# Patient Record
Sex: Male | Born: 1955
Health system: Southern US, Community
[De-identification: ages and names within clinical notes are randomized; demographics above are authoritative.]

## PROBLEM LIST (undated history)

## (undated) HISTORY — PX: WISDOM TOOTH EXTRACTION: SHX21

## (undated) HISTORY — PX: ANKLE FRACTURE SURGERY: SHX122

---

## 2005-02-05 ENCOUNTER — Encounter: Admission: RE | Admit: 2005-02-05 | Discharge: 2005-02-05 | Payer: Self-pay | Admitting: Family Medicine

## 2005-02-12 ENCOUNTER — Encounter: Admission: RE | Admit: 2005-02-12 | Discharge: 2005-02-12 | Payer: Self-pay | Admitting: Family Medicine

## 2005-10-27 ENCOUNTER — Emergency Department (HOSPITAL_COMMUNITY): Admission: EM | Admit: 2005-10-27 | Discharge: 2005-10-27 | Payer: Self-pay | Admitting: *Deleted

## 2005-11-25 ENCOUNTER — Ambulatory Visit (HOSPITAL_COMMUNITY): Admission: RE | Admit: 2005-11-25 | Discharge: 2005-11-25 | Payer: Self-pay | Admitting: Orthopedic Surgery

## 2007-01-28 IMAGING — CR DG LUMBAR SPINE COMPLETE 4+V
5 series · 5 of 5 positions shown · non-contrast
Comparison: none

CLINICAL DATA: Low back pain radiating down the right leg.
 LUMBAR SPINE COMPLETE ? FOUR VIEWS:

[view not recorded (1 of 5)]
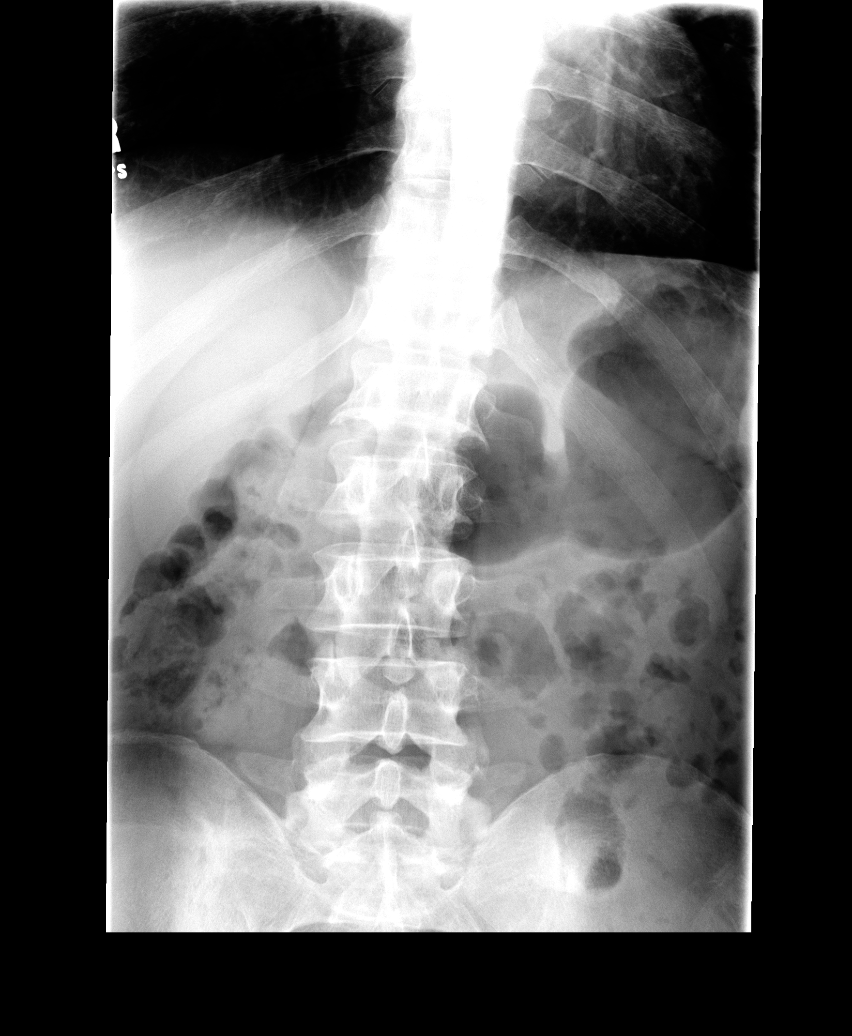

[view not recorded (2 of 5)]
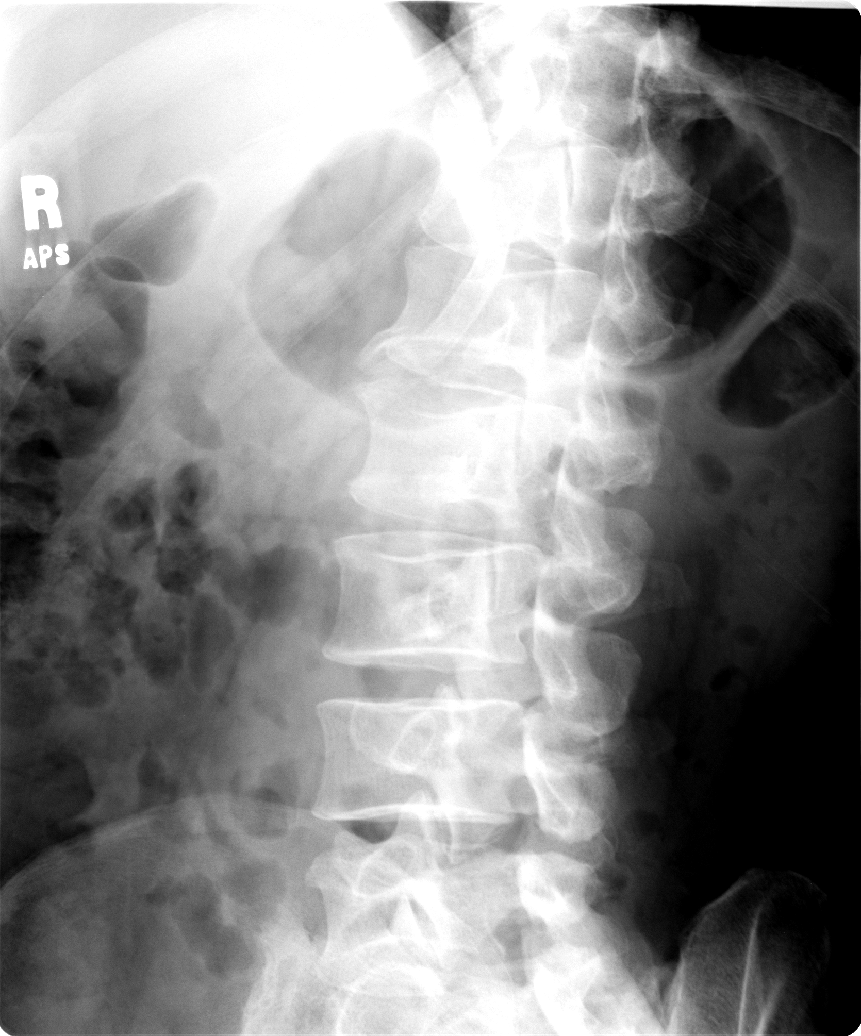

[view not recorded (3 of 5)]
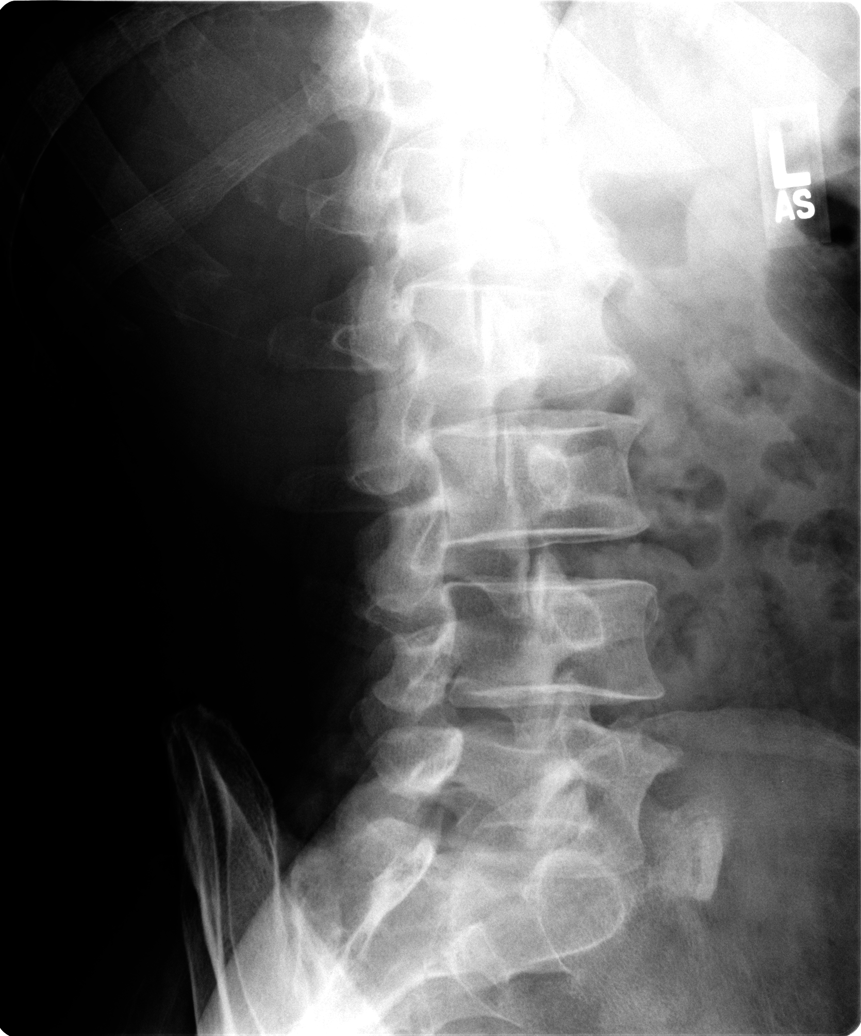

[view not recorded (4 of 5)]
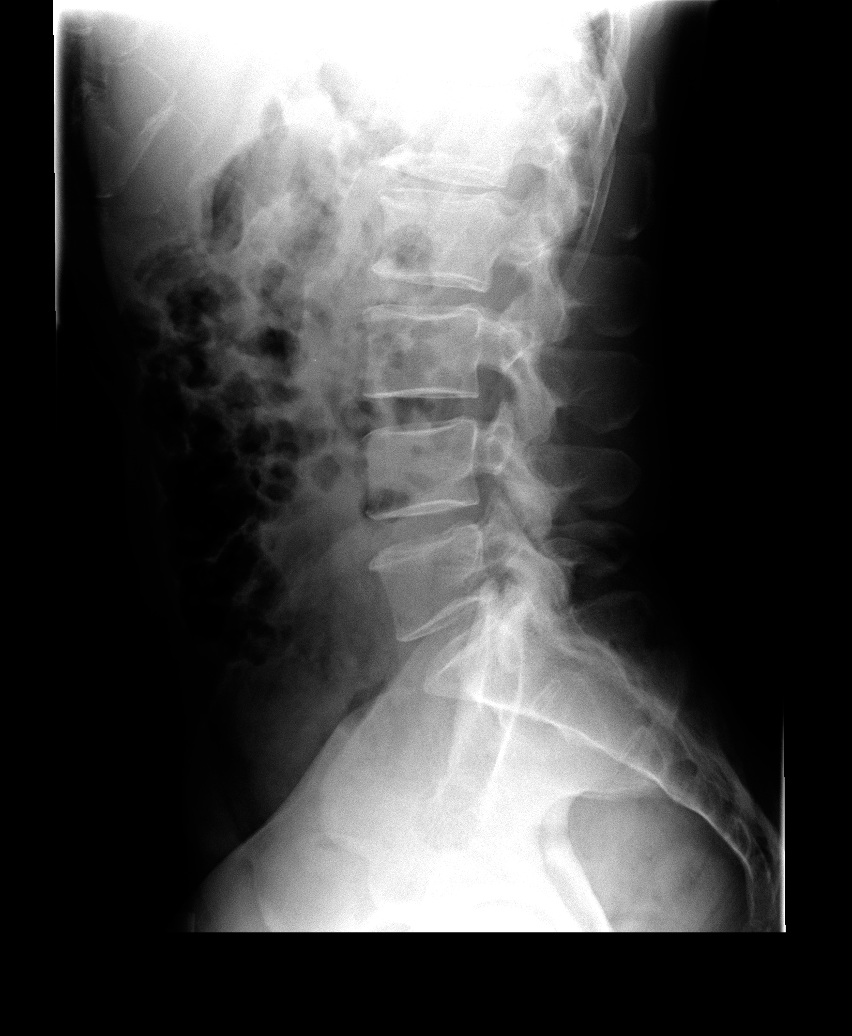

[view not recorded (5 of 5)]
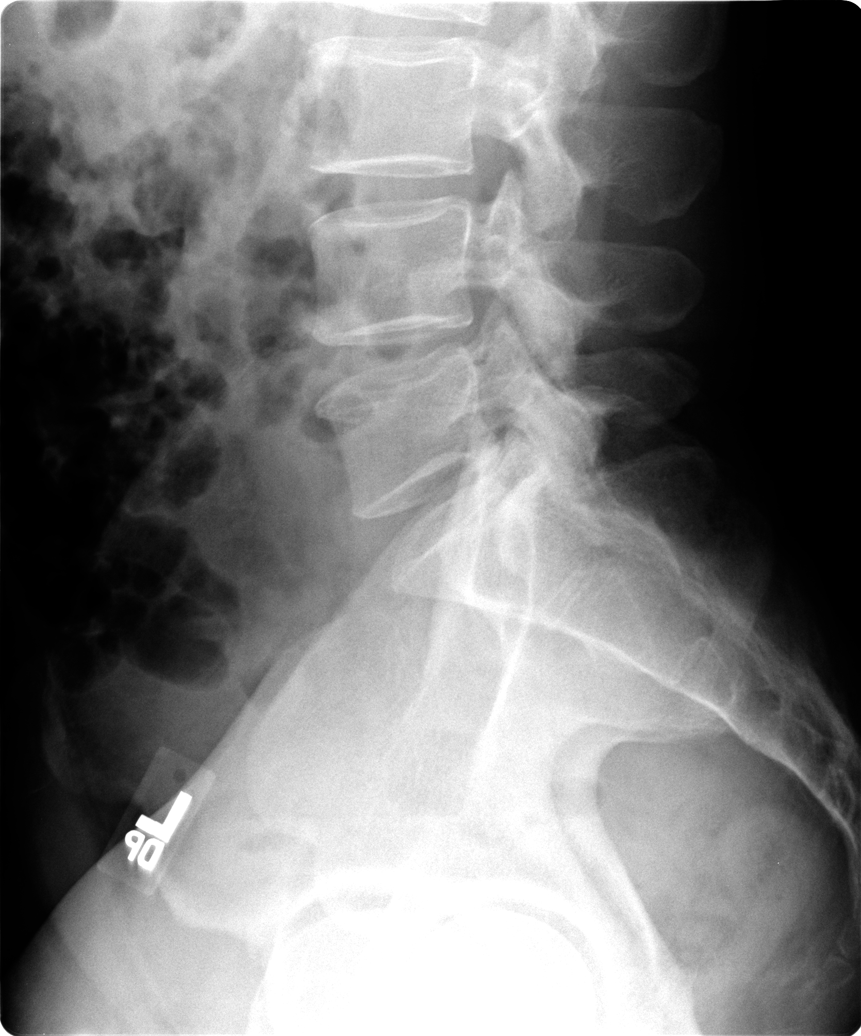

[5 of 5 positions shown; findings below may reference images not displayed]

FINDINGS: There are five lumbar type vertebral bodies.  There is minimal scoliosis convex to the right.  There is degenerative disk disease at L1-2 with small anterior and lateral osteophytes.  Other disk space heights are normal.  There is mild facet degeneration bilaterally at L4-5 and L5-S1, probably subclinical.  No pars defects or slippage.  The patient does have some osteoarthritic change of the sacroiliac joints noted.
IMPRESSION: 1.  Osteoarthritic change of the sacroiliac joints.  
 2.  Mild lower lumbar facet degeneration.
 3.  Degenerative disk disease at L1-2 of doubtful significance.

## 2008-12-11 ENCOUNTER — Emergency Department (HOSPITAL_COMMUNITY): Admission: EM | Admit: 2008-12-11 | Discharge: 2008-12-11 | Payer: Self-pay | Admitting: Emergency Medicine

## 2011-08-17 ENCOUNTER — Encounter (HOSPITAL_COMMUNITY): Payer: Self-pay | Admitting: *Deleted

## 2011-08-17 ENCOUNTER — Encounter (HOSPITAL_COMMUNITY)
Admission: RE | Disposition: A | Discharge status: Home / Self Care | Payer: Self-pay | Source: Ambulatory Visit | Attending: Gastroenterology

## 2011-08-17 ENCOUNTER — Encounter (HOSPITAL_COMMUNITY): Payer: Self-pay | Admitting: Anesthesiology

## 2011-08-17 ENCOUNTER — Ambulatory Visit (HOSPITAL_COMMUNITY)
Admission: RE | Admit: 2011-08-17 | Discharge: 2011-08-17 | Disposition: A | Discharge status: Home / Self Care | Payer: BC Managed Care – PPO | Source: Ambulatory Visit | Attending: Gastroenterology | Admitting: Gastroenterology

## 2011-08-17 ENCOUNTER — Ambulatory Visit (HOSPITAL_COMMUNITY): Payer: BC Managed Care – PPO | Admitting: Anesthesiology

## 2011-08-17 DIAGNOSIS — K5730 Diverticulosis of large intestine without perforation or abscess without bleeding: Secondary | ICD-10-CM | POA: Insufficient documentation

## 2011-08-17 DIAGNOSIS — K648 Other hemorrhoids: Secondary | ICD-10-CM | POA: Insufficient documentation

## 2011-08-17 DIAGNOSIS — D126 Benign neoplasm of colon, unspecified: Secondary | ICD-10-CM | POA: Insufficient documentation

## 2011-08-17 HISTORY — PX: POLYPECTOMY: SHX5525

## 2011-08-17 HISTORY — PX: COLONOSCOPY: SHX5424

## 2011-08-17 LAB — TYPE AND SCREEN: Antibody Screen: NEGATIVE

## 2011-08-17 SURGERY — COLONOSCOPY
Anesthesia: Monitor Anesthesia Care

## 2011-08-17 MED ORDER — KETAMINE HCL 10 MG/ML IJ SOLN
INTRAMUSCULAR | Status: DC | PRN
  Administered 2011-08-17 (×2): 10 mg via INTRAVENOUS

## 2011-08-17 MED ORDER — PROMETHAZINE HCL 25 MG/ML IJ SOLN: 6.2500 mg | INTRAMUSCULAR | Status: DC | PRN

## 2011-08-17 MED ORDER — PROPOFOL 10 MG/ML IV EMUL
INTRAVENOUS | Status: DC | PRN
  Administered 2011-08-17: 120 ug/kg/min via INTRAVENOUS

## 2011-08-17 MED ORDER — MIDAZOLAM HCL 5 MG/5ML IJ SOLN
INTRAMUSCULAR | Status: DC | PRN
  Administered 2011-08-17: 2 mg via INTRAVENOUS
  Administered 2011-08-17 (×2): 1 mg via INTRAVENOUS

## 2011-08-17 MED ORDER — FENTANYL CITRATE 0.05 MG/ML IJ SOLN
INTRAMUSCULAR | Status: DC | PRN
  Administered 2011-08-17: 50 ug via INTRAVENOUS
  Administered 2011-08-17: 100 ug via INTRAVENOUS

## 2011-08-17 MED ORDER — LACTATED RINGERS IV SOLN
INTRAVENOUS | Status: DC
  Administered 2011-08-17: 1000 mL via INTRAVENOUS

## 2011-08-17 NOTE — Progress Notes (Signed)
Jeff Hicks 10:33 AM  Subjective: Patient is asymptomatic from a GI standpoint had no problems from his previous colonoscopy and his procedure was rediscussed with he and his wife  Objective: Vital signs stable afebrile exam please see preprocedure assessment physical  Assessment: Large necrotic right colon pedunculated polyp  Plan: Okay for anesthesia and repeat colonoscopy and polypectomy And the risks were rediscussed  [facility]

## 2011-08-17 NOTE — Anesthesia Postprocedure Evaluation (Signed)
  Anesthesia Post-op Note  Patient: Jeff Hicks  Procedure(s) Performed: Procedure(s) (LRB): COLONOSCOPY (N/A) POLYPECTOMY (N/A)  Patient Location: PACU  Anesthesia Type: MAC  Level of Consciousness: awake and alert   Airway and Oxygen Therapy: Patient Spontanous Breathing  Post-op Pain: mild  Post-op Assessment: Post-op Vital signs reviewed, Patient's Cardiovascular Status Stable, Respiratory Function Stable, Patent Airway and No signs of Nausea or vomiting  Post-op Vital Signs: stable  Complications: No apparent anesthesia complications

## 2011-08-17 NOTE — Anesthesia Preprocedure Evaluation (Addendum)
Anesthesia Evaluation  Patient identified by MRN, date of birth, ID band Patient awake    Reviewed: Allergy & Precautions, H&P , NPO status , Patient's Chart, lab work & pertinent test results  Airway Mallampati: II TM Distance: >3 FB Neck ROM: Full    Dental No notable dental hx.    Pulmonary neg pulmonary ROS,  breath sounds clear to auscultation  Pulmonary exam normal       Cardiovascular negative cardio ROS  Rhythm:Regular Rate:Normal     Neuro/Psych negative neurological ROS  negative psych ROS   GI/Hepatic negative GI ROS, Neg liver ROS,   Endo/Other  negative endocrine ROS  Renal/GU negative Renal ROS  negative genitourinary   Musculoskeletal negative musculoskeletal ROS (+)   Abdominal   Peds negative pediatric ROS (+)  Hematology negative hematology ROS (+)   Anesthesia Other Findings   Reproductive/Obstetrics negative OB ROS                          Anesthesia Physical Anesthesia Plan  ASA: I  Anesthesia Plan: MAC   Post-op Pain Management:    Induction: Intravenous  Airway Management Planned: Nasal Cannula  Additional Equipment:   Intra-op Plan:   Post-operative Plan:   Informed Consent: I have reviewed the patients History and Physical, chart, labs and discussed the procedure including the risks, benefits and alternatives for the proposed anesthesia with the patient or authorized representative who has indicated his/her understanding and acceptance.     Plan Discussed with: CRNA  Anesthesia Plan Comments:        Anesthesia Quick Evaluation

## 2011-08-17 NOTE — Transfer of Care (Signed)
Immediate Anesthesia Transfer of Care Note  Patient: Jeff Hicks  Procedure(s) Performed: Procedure(s) (LRB): COLONOSCOPY (N/A) POLYPECTOMY (N/A)  Patient Location: PACU and Endoscopy Unit  Anesthesia Type: MAC  Level of Consciousness: awake, sedated and patient cooperative  Airway & Oxygen Therapy: Patient Spontanous Breathing and Patient connected to nasal cannula oxygen  Post-op Assessment: Report given to PACU RN and Post -op Vital signs reviewed and stable  Post vital signs: Reviewed and stable  Complications: No apparent anesthesia complications

## 2011-08-17 NOTE — Discharge Instructions (Addendum)
No aspirin or arthritis pills x2 weeks and Tylenol over-the-counter only. Call Monday for biopsy report and call sooner if any question or problem  Colonoscopy Care After Read the instructions outlined below and refer to this sheet in the next few weeks. These discharge instructions provide you with general information on caring for yourself after you leave the hospital. Your doctor may also give you specific instructions. While your treatment has been planned according to the most current medical practices available, unavoidable complications occasionally occur. If you have any problems or questions after discharge, call your doctor. HOME CARE INSTRUCTIONS ACTIVITY:  You may resume your regular activity, but move at a slower pace for the next 24 hours.   Take frequent rest periods for the next 24 hours.   Walking will help get rid of the air and reduce the bloated feeling in your belly (abdomen).   No driving for 24 hours (because of the medicine (anesthesia) used during the test).   You may shower.   Do not sign any important legal documents or operate any machinery for 24 hours (because of the anesthesia used during the test).  NUTRITION:  Drink plenty of fluids.   You may resume your normal diet as instructed by your doctor.   Begin with a light meal and progress to your normal diet. Heavy or fried foods are harder to digest and may make you feel sick to your stomach (nauseated).   Avoid alcoholic beverages for 24 hours or as instructed.  MEDICATIONS:  You may resume your normal medications unless your doctor tells you otherwise.  WHAT TO EXPECT TODAY:  Some feelings of bloating in the abdomen.   Passage of more gas than usual.   Spotting of blood in your stool or on the toilet paper.  IF YOU HAD POLYPS REMOVED DURING THE COLONOSCOPY:  No aspirin products for 7 days or as instructed.   No alcohol for 7 days or as instructed.   Eat a soft diet for the next 24 hours.    FINDING OUT THE RESULTS OF YOUR TEST Not all test results are available during your visit. If your test results are not back during the visit, make an appointment with your caregiver to find out the results. Do not assume everything is normal if you have not heard from your caregiver or the medical facility. It is important for you to follow up on all of your test results.  SEEK IMMEDIATE MEDICAL CARE IF:  You have more than a spotting of blood in your stool.   Your belly is swollen (abdominal distention).   You are nauseated or vomiting.   You have a fever.   You have abdominal pain or discomfort that is severe or gets worse throughout the day.    Diverticulosis Diverticulosis is a common condition that develops when small pouches (diverticula) form in the wall of the colon. The risk of diverticulosis increases with age. It happens more often in people who eat a low-fiber diet. Most individuals with diverticulosis have no symptoms. Those individuals with symptoms usually experience abdominal pain, constipation, or loose stools (diarrhea). HOME CARE INSTRUCTIONS   Increase the amount of fiber in your diet as directed by your caregiver or dietician. This may reduce symptoms of diverticulosis.   Your caregiver may recommend taking a dietary fiber supplement.   Drink at least 6 to 8 glasses of water each day to prevent constipation.   Try not to strain when you have a bowel movement.  Your caregiver may recommend avoiding nuts and seeds to prevent complications, although this is still an uncertain benefit.   Only take over-the-counter or prescription medicines for pain, discomfort, or fever as directed by your caregiver.  FOODS WITH HIGH FIBER CONTENT INCLUDE:  Fruits. Apple, peach, pear, tangerine, raisins, prunes.   Vegetables. Brussels sprouts, asparagus, broccoli, cabbage, carrot, cauliflower, romaine lettuce, spinach, summer squash, tomato, winter squash, zucchini.   Starchy  Vegetables. Baked beans, kidney beans, lima beans, split peas, lentils, potatoes (with skin).   Grains. Whole wheat bread, brown rice, bran flake cereal, plain oatmeal, white rice, shredded wheat, bran muffins.  SEEK IMMEDIATE MEDICAL CARE IF:   You develop increasing pain or severe bloating.   You have an oral temperature above 102 F (38.9 C), not controlled by medicine.   You develop vomiting or bowel movements that are bloody or black.    Colon Polyps A polyp is extra tissue that grows inside your body. Colon polyps grow in the large intestine. The large intestine, also called the colon, is part of your digestive system. It is a long, hollow tube at the end of your digestive tract where your body makes and stores stool. Most polyps are not dangerous. They are benign. This means they are not cancerous. But over time, some types of polyps can turn into cancer. Polyps that are smaller than a pea are usually not harmful. But larger polyps could someday become or may already be cancerous. To be safe, doctors remove all polyps and test them.  WHO GETS POLYPS? Anyone can get polyps, but certain people are more likely than others. You may have a greater chance of getting polyps if:  You are over 50.   You have had polyps before.   Someone in your family has had polyps.   Someone in your family has had cancer of the large intestine.   Find out if someone in your family has had polyps. You may also be more likely to get polyps if you:   Eat a lot of fatty foods.   Smoke.   Drink alcohol.   Do not exercise.   Eat too much.  SYMPTOMS  Most small polyps do not cause symptoms. People often do not know they have one until their caregiver finds it during a regular checkup or while testing them for something else. Some people do have symptoms like these:  Bleeding from the anus. You might notice blood on your underwear or on toilet paper after you have had a bowel movement.    Constipation or diarrhea that lasts more than a week.   Blood in the stool. Blood can make stool look black or it can show up as red streaks in the stool.  If you have any of these symptoms, see your caregiver. HOW DOES THE DOCTOR TEST FOR POLYPS? The doctor can use four tests to check for polyps:  Digital rectal exam. The caregiver wears gloves and checks your rectum (the last part of the large intestine) to see if it feels normal. This test would find polyps only in the rectum. Your caregiver may need to do one of the other tests listed below to find polyps higher up in the intestine.   Barium enema. The caregiver puts a liquid called barium into your rectum before taking x-rays of your large intestine. Barium makes your intestine look white in the pictures. Polyps are dark, so they are easy to see.   Sigmoidoscopy. With this test, the caregiver can  see inside your large intestine. A thin flexible tube is placed into your rectum. The device is called a sigmoidoscope, which has a light and a tiny video camera in it. The caregiver uses the sigmoidoscope to look at the last third of your large intestine.   Colonoscopy. This test is like sigmoidoscopy, but the caregiver looks at all of the large intestine. It usually requires sedation. This is the most common method for finding and removing polyps.  TREATMENT   The caregiver will remove the polyp during sigmoidoscopy or colonoscopy. The polyp is then tested for cancer.   If you have had polyps, your caregiver may want you to get tested regularly in the future.  PREVENTION  There is not one sure way to prevent polyps. You might be able to lower your risk of getting them if you:  Eat more fruits and vegetables and less fatty food.   Do not smoke.   Avoid alcohol.   Exercise every day.   Lose weight if you are overweight.   Eating more calcium and folate can also lower your risk of getting polyps. Some foods that are rich in calcium  are milk, cheese, and broccoli. Some foods that are rich in folate are chickpeas, kidney beans, and spinach.   Aspirin might help prevent polyps. Studies are under way.

## 2011-08-17 NOTE — Op Note (Signed)
[facility] [address] [place], [place]  [address]  COLONOSCOPY PROCEDURE REPORT  PATIENT:  Jeff Hicks, Jeff Hicks  MR#:  [id number] BIRTHDATE:  Jan 17, 1956, 56 yrs. old  GENDER:  male ENDOSCOPIST:  Vida Rigger, MD REF. BY:  Lupe Carney, M.D. PROCEDURE DATE:  08/17/2011 PROCEDURE:  Colonoscopy with biopsy, Colonoscopy with snare polypectomy ASA CLASS:  Class I INDICATIONS:  history of difficult to remove polyp on screening colonoscopy MEDICATIONS:  170 mg Diprivan 4 mg Versed 150 mcg fentanyl DESCRIPTION OF PROCEDURE:   After the risks benefits and alternatives of the procedure were thoroughly explained, informed consent was obtained.  The Pentax Colonoscope [id number] endoscope was introduced through the anus and advanced to the cecum, which was identified by both the appendix and ileocecal valve, without limitations.  This did require abdominal pressure but no position changes The quality of the prep was adequate..  The instrument was then slowly withdrawn as the colon was fully examined. The large pedunculated polyp was opposite the ileocecal valve and a broad stalk was confirmed and the polyp was snared and electrocautery applied and the polyp was removed without bleeding. The residual stalk and fold was slightly nodular and instead of snaring this as well we elected to take cold biopsies to see if this was residual polypoid tissue and that was put in a separate container. A tiny polyp just distal to this area was seen and cold biopsied and removed and put in a separate container. Not mentioned above on insertion a few small early left-sided diverticuli were seen but no other abnormalities. We then rere snared the polyp suctioned it on to the head of the scope and the polyp and the scope were removed and the polyp was recovered. We then were easily able to rere advance the scope back to the cecum there was no signs of bleeding and no obvious complication and again no  additional findings were seen on insertion air was suctioned scope was removed. Prior to removal anal rectal pull-through and retroflexion did reveal some small internal hemorrhoids only. Patient tolerated the procedure well there was no obvious immediate complication <<PROCEDUREIMAGES>>  FINDINGS: 1. Small internal hemorrhoids 2. Few early left-sided diverticuli #3. Tiny proximal descending polyp cold biopsied 4. Large pedunculated polyp opposite the ileocecal valve status post snare and removed and retrieved 5. Nodular residual stalk and fold status post biopsy #6. Otherwise within normal limits to the cecum  COMPLICATIONS:  None  IMPRESSION: Above  RECOMMENDATIONS: Observe for delayed complications await pathology to determine future workup plans and screening  ______________________________ Vida Rigger, MD  CC:  Lupe Carney, MD  n. Rosalie DoctorVida Rigger at 08/17/2011 11:36 AM  Jimmye Norman, [id number]

## 2011-08-18 ENCOUNTER — Encounter (HOSPITAL_COMMUNITY): Payer: Self-pay | Admitting: Gastroenterology

## 2012-07-27 ENCOUNTER — Other Ambulatory Visit: Payer: Self-pay | Admitting: Dermatology

## 2016-03-01 DIAGNOSIS — Z0000 Encounter for general adult medical examination without abnormal findings: Secondary | ICD-10-CM | POA: Diagnosis not present

## 2016-03-01 DIAGNOSIS — E7800 Pure hypercholesterolemia, unspecified: Secondary | ICD-10-CM | POA: Diagnosis not present

## 2016-03-01 DIAGNOSIS — Z23 Encounter for immunization: Secondary | ICD-10-CM | POA: Diagnosis not present

## 2016-03-01 DIAGNOSIS — Z125 Encounter for screening for malignant neoplasm of prostate: Secondary | ICD-10-CM | POA: Diagnosis not present

## 2016-07-26 DIAGNOSIS — Z85828 Personal history of other malignant neoplasm of skin: Secondary | ICD-10-CM | POA: Diagnosis not present

## 2016-07-26 DIAGNOSIS — H61002 Unspecified perichondritis of left external ear: Secondary | ICD-10-CM | POA: Diagnosis not present

## 2016-07-26 DIAGNOSIS — L570 Actinic keratosis: Secondary | ICD-10-CM | POA: Diagnosis not present

## 2016-07-26 DIAGNOSIS — D1801 Hemangioma of skin and subcutaneous tissue: Secondary | ICD-10-CM | POA: Diagnosis not present

## 2017-03-07 DIAGNOSIS — Z0000 Encounter for general adult medical examination without abnormal findings: Secondary | ICD-10-CM | POA: Diagnosis not present

## 2017-03-07 DIAGNOSIS — E7800 Pure hypercholesterolemia, unspecified: Secondary | ICD-10-CM | POA: Diagnosis not present

## 2017-03-07 DIAGNOSIS — Z125 Encounter for screening for malignant neoplasm of prostate: Secondary | ICD-10-CM | POA: Diagnosis not present

## 2017-05-31 DIAGNOSIS — M9902 Segmental and somatic dysfunction of thoracic region: Secondary | ICD-10-CM | POA: Diagnosis not present

## 2017-05-31 DIAGNOSIS — M9903 Segmental and somatic dysfunction of lumbar region: Secondary | ICD-10-CM | POA: Diagnosis not present

## 2017-05-31 DIAGNOSIS — M9901 Segmental and somatic dysfunction of cervical region: Secondary | ICD-10-CM | POA: Diagnosis not present

## 2017-05-31 DIAGNOSIS — M62830 Muscle spasm of back: Secondary | ICD-10-CM | POA: Diagnosis not present

## 2017-06-01 DIAGNOSIS — M62830 Muscle spasm of back: Secondary | ICD-10-CM | POA: Diagnosis not present

## 2017-06-01 DIAGNOSIS — M9901 Segmental and somatic dysfunction of cervical region: Secondary | ICD-10-CM | POA: Diagnosis not present

## 2017-06-01 DIAGNOSIS — M9903 Segmental and somatic dysfunction of lumbar region: Secondary | ICD-10-CM | POA: Diagnosis not present

## 2017-06-01 DIAGNOSIS — M9902 Segmental and somatic dysfunction of thoracic region: Secondary | ICD-10-CM | POA: Diagnosis not present

## 2017-06-02 DIAGNOSIS — M9902 Segmental and somatic dysfunction of thoracic region: Secondary | ICD-10-CM | POA: Diagnosis not present

## 2017-06-02 DIAGNOSIS — M9901 Segmental and somatic dysfunction of cervical region: Secondary | ICD-10-CM | POA: Diagnosis not present

## 2017-06-02 DIAGNOSIS — M9903 Segmental and somatic dysfunction of lumbar region: Secondary | ICD-10-CM | POA: Diagnosis not present

## 2017-06-02 DIAGNOSIS — M62830 Muscle spasm of back: Secondary | ICD-10-CM | POA: Diagnosis not present

## 2017-06-06 DIAGNOSIS — M62830 Muscle spasm of back: Secondary | ICD-10-CM | POA: Diagnosis not present

## 2017-06-06 DIAGNOSIS — M9902 Segmental and somatic dysfunction of thoracic region: Secondary | ICD-10-CM | POA: Diagnosis not present

## 2017-06-06 DIAGNOSIS — M9903 Segmental and somatic dysfunction of lumbar region: Secondary | ICD-10-CM | POA: Diagnosis not present

## 2017-06-06 DIAGNOSIS — M9901 Segmental and somatic dysfunction of cervical region: Secondary | ICD-10-CM | POA: Diagnosis not present

## 2017-06-07 DIAGNOSIS — M9902 Segmental and somatic dysfunction of thoracic region: Secondary | ICD-10-CM | POA: Diagnosis not present

## 2017-06-07 DIAGNOSIS — M9901 Segmental and somatic dysfunction of cervical region: Secondary | ICD-10-CM | POA: Diagnosis not present

## 2017-06-07 DIAGNOSIS — M62830 Muscle spasm of back: Secondary | ICD-10-CM | POA: Diagnosis not present

## 2017-06-07 DIAGNOSIS — M9903 Segmental and somatic dysfunction of lumbar region: Secondary | ICD-10-CM | POA: Diagnosis not present

## 2017-06-08 DIAGNOSIS — M9903 Segmental and somatic dysfunction of lumbar region: Secondary | ICD-10-CM | POA: Diagnosis not present

## 2017-06-08 DIAGNOSIS — M9901 Segmental and somatic dysfunction of cervical region: Secondary | ICD-10-CM | POA: Diagnosis not present

## 2017-06-08 DIAGNOSIS — M9902 Segmental and somatic dysfunction of thoracic region: Secondary | ICD-10-CM | POA: Diagnosis not present

## 2017-06-08 DIAGNOSIS — M62830 Muscle spasm of back: Secondary | ICD-10-CM | POA: Diagnosis not present

## 2017-06-09 DIAGNOSIS — M9903 Segmental and somatic dysfunction of lumbar region: Secondary | ICD-10-CM | POA: Diagnosis not present

## 2017-06-09 DIAGNOSIS — M9901 Segmental and somatic dysfunction of cervical region: Secondary | ICD-10-CM | POA: Diagnosis not present

## 2017-06-09 DIAGNOSIS — M9902 Segmental and somatic dysfunction of thoracic region: Secondary | ICD-10-CM | POA: Diagnosis not present

## 2017-06-09 DIAGNOSIS — M62830 Muscle spasm of back: Secondary | ICD-10-CM | POA: Diagnosis not present

## 2017-06-13 DIAGNOSIS — M9901 Segmental and somatic dysfunction of cervical region: Secondary | ICD-10-CM | POA: Diagnosis not present

## 2017-06-13 DIAGNOSIS — M9903 Segmental and somatic dysfunction of lumbar region: Secondary | ICD-10-CM | POA: Diagnosis not present

## 2017-06-13 DIAGNOSIS — M9902 Segmental and somatic dysfunction of thoracic region: Secondary | ICD-10-CM | POA: Diagnosis not present

## 2017-06-13 DIAGNOSIS — M62830 Muscle spasm of back: Secondary | ICD-10-CM | POA: Diagnosis not present

## 2017-06-14 DIAGNOSIS — M62830 Muscle spasm of back: Secondary | ICD-10-CM | POA: Diagnosis not present

## 2017-06-14 DIAGNOSIS — M9903 Segmental and somatic dysfunction of lumbar region: Secondary | ICD-10-CM | POA: Diagnosis not present

## 2017-06-14 DIAGNOSIS — M9901 Segmental and somatic dysfunction of cervical region: Secondary | ICD-10-CM | POA: Diagnosis not present

## 2017-06-14 DIAGNOSIS — M9902 Segmental and somatic dysfunction of thoracic region: Secondary | ICD-10-CM | POA: Diagnosis not present

## 2017-06-15 DIAGNOSIS — M9901 Segmental and somatic dysfunction of cervical region: Secondary | ICD-10-CM | POA: Diagnosis not present

## 2017-06-15 DIAGNOSIS — M62830 Muscle spasm of back: Secondary | ICD-10-CM | POA: Diagnosis not present

## 2017-06-15 DIAGNOSIS — M9903 Segmental and somatic dysfunction of lumbar region: Secondary | ICD-10-CM | POA: Diagnosis not present

## 2017-06-15 DIAGNOSIS — M9902 Segmental and somatic dysfunction of thoracic region: Secondary | ICD-10-CM | POA: Diagnosis not present

## 2017-06-27 DIAGNOSIS — M9902 Segmental and somatic dysfunction of thoracic region: Secondary | ICD-10-CM | POA: Diagnosis not present

## 2017-06-27 DIAGNOSIS — M9901 Segmental and somatic dysfunction of cervical region: Secondary | ICD-10-CM | POA: Diagnosis not present

## 2017-06-27 DIAGNOSIS — M9903 Segmental and somatic dysfunction of lumbar region: Secondary | ICD-10-CM | POA: Diagnosis not present

## 2017-06-27 DIAGNOSIS — M62830 Muscle spasm of back: Secondary | ICD-10-CM | POA: Diagnosis not present

## 2017-06-28 DIAGNOSIS — M9901 Segmental and somatic dysfunction of cervical region: Secondary | ICD-10-CM | POA: Diagnosis not present

## 2017-06-28 DIAGNOSIS — M9902 Segmental and somatic dysfunction of thoracic region: Secondary | ICD-10-CM | POA: Diagnosis not present

## 2017-06-28 DIAGNOSIS — M62830 Muscle spasm of back: Secondary | ICD-10-CM | POA: Diagnosis not present

## 2017-06-28 DIAGNOSIS — M9903 Segmental and somatic dysfunction of lumbar region: Secondary | ICD-10-CM | POA: Diagnosis not present

## 2017-06-29 DIAGNOSIS — M62830 Muscle spasm of back: Secondary | ICD-10-CM | POA: Diagnosis not present

## 2017-06-29 DIAGNOSIS — M9902 Segmental and somatic dysfunction of thoracic region: Secondary | ICD-10-CM | POA: Diagnosis not present

## 2017-06-29 DIAGNOSIS — M9903 Segmental and somatic dysfunction of lumbar region: Secondary | ICD-10-CM | POA: Diagnosis not present

## 2017-06-29 DIAGNOSIS — M9901 Segmental and somatic dysfunction of cervical region: Secondary | ICD-10-CM | POA: Diagnosis not present

## 2017-07-24 ENCOUNTER — Encounter (HOSPITAL_COMMUNITY): Payer: Self-pay | Admitting: Emergency Medicine

## 2017-07-24 ENCOUNTER — Emergency Department (HOSPITAL_COMMUNITY)
Admission: EM | Admit: 2017-07-24 | Discharge: 2017-07-24 | Disposition: A | Discharge status: Home / Self Care | Payer: BLUE CROSS/BLUE SHIELD | Attending: Emergency Medicine | Admitting: Emergency Medicine

## 2017-07-24 ENCOUNTER — Emergency Department (HOSPITAL_COMMUNITY): Payer: BLUE CROSS/BLUE SHIELD

## 2017-07-24 DIAGNOSIS — Y929 Unspecified place or not applicable: Secondary | ICD-10-CM | POA: Insufficient documentation

## 2017-07-24 DIAGNOSIS — S86021A Laceration of right Achilles tendon, initial encounter: Secondary | ICD-10-CM | POA: Insufficient documentation

## 2017-07-24 DIAGNOSIS — Y999 Unspecified external cause status: Secondary | ICD-10-CM | POA: Insufficient documentation

## 2017-07-24 DIAGNOSIS — Y9389 Activity, other specified: Secondary | ICD-10-CM | POA: Insufficient documentation

## 2017-07-24 DIAGNOSIS — W25XXXA Contact with sharp glass, initial encounter: Secondary | ICD-10-CM | POA: Diagnosis not present

## 2017-07-24 DIAGNOSIS — S86011A Strain of right Achilles tendon, initial encounter: Secondary | ICD-10-CM

## 2017-07-24 DIAGNOSIS — S91011A Laceration without foreign body, right ankle, initial encounter: Secondary | ICD-10-CM | POA: Diagnosis not present

## 2017-07-24 MED ORDER — LIDOCAINE-EPINEPHRINE (PF) 2 %-1:200000 IJ SOLN
20.0000 mL | Freq: Once | INTRAMUSCULAR | Status: AC
  Administered 2017-07-24: 20 mL
  Filled 2017-07-24: qty 20

## 2017-07-24 MED ORDER — CEPHALEXIN 500 MG PO CAPS: 500.0000 mg | ORAL_CAPSULE | Freq: Four times a day (QID) | ORAL | 0 refills | Status: AC

## 2017-07-24 NOTE — ED Provider Notes (Signed)
[facility] DEPT Provider Note   CSN: [id number] Arrival date & time: 07/24/17  1158     History   Chief Complaint Chief Complaint  Patient presents with  . Foot Injury    HPI Jeff Hicks is a 62 y.o. male.  Jeff Hicks is a 62 y.o. Male who is otherwise healthy, presents to the ED for evaluation of laceration to the back of the right heel.  Patient reports that about 1 AM he got up to have some water and dropped a glass, he reports it caused a laceration to the back of his heel, he did not want to wake his wife when she saw this morning she insisted he come in for evaluation.  Patient reports it has been painful and tender and he has had issues bearing weight since then.  Bleeding is controlled.  Patient reports he is up-to-date on his tetanus vaccine.  Patient reports he is unsure how deep the laceration was, but his wife was concerned it might involve the tendon.  Patient is able to plantarflex and dorsiflex the foot some water although reports is painful.  No meds prior to arrival.  Not on any blood thinners.  No numbness, tingling or weakness in the foot or ankle.     History reviewed. No pertinent past medical history.  There are no active problems to display for this patient.   Past Surgical History:  Procedure Laterality Date  . ANKLE FRACTURE SURGERY  15-20 years ago   Pins & Screws in place  . COLONOSCOPY  08/17/2011   Procedure: COLONOSCOPY;  Surgeon: Jeryl Columbia, MD;  Location: [facility] ENDOSCOPY;  Service: Endoscopy;  Laterality: N/A;  hard to remove polyp  . POLYPECTOMY  08/17/2011   Procedure: POLYPECTOMY;  Surgeon: Jeryl Columbia, MD;  Location: [facility] ENDOSCOPY;  Service: Endoscopy;  Laterality: N/A;  . WISDOM TOOTH EXTRACTION          Home Medications    Prior to Admission medications   Not on File    Family History No family history on file.  Social History Social History   Tobacco Use  . Smoking status: Never Smoker   . Smokeless tobacco: Never Used  Substance Use Topics  . Alcohol use: Yes    Comment: social  . Drug use: No     Allergies   Patient has no known allergies.   Review of Systems Review of Systems  Constitutional: Negative for chills and fever.  Musculoskeletal: Negative for arthralgias and joint swelling.  Skin: Positive for wound.  Neurological: Negative for weakness and numbness.     Physical Exam Updated Vital Signs BP (!) 143/73 (BP Location: Left Arm)   Pulse 62   Temp 98.6 F (37 C) (Oral)   Resp 18   SpO2 100%   Physical Exam  Constitutional: He appears well-developed and well-nourished. No distress.  HENT:  Head: Normocephalic and atraumatic.  Eyes: Right eye exhibits no discharge. Left eye exhibits no discharge.  Pulmonary/Chest: Effort normal. No respiratory distress.  Musculoskeletal:  2.5 cm laceration across the back of the right ankle, exploration of the wound does show evidence of partial tendon laceration, but patient does still have some dorsiflexion with Thompson test, 2+ DP and TP pulses and good capillary refill, sensation intact.   Neurological: He is alert. Coordination normal.  Skin: He is not diaphoretic.  Psychiatric: He has a normal mood and affect. His behavior is normal.  Nursing note and vitals reviewed.  ED Treatments / Results  Labs (all labs ordered are listed, but only abnormal results are displayed) Labs Reviewed - No data to display  EKG None  Radiology Dg Ankle Complete Right  Result Date: 07/24/2017 CLINICAL DATA:  Dropped glass on ankle. Pain and posterior laceration. Initial encounter. EXAM: RIGHT ANKLE - COMPLETE 3+ VIEW COMPARISON:  None. FINDINGS: There is no evidence of fracture, dislocation, or joint effusion. There is no evidence of arthropathy or other focal bone abnormality. Soft tissue gas is seen posteriorly along the distal Achilles tendon, however there is no evidence of radiopaque foreign body.  IMPRESSION: Soft tissue gas in region of distal Achilles tendon. No evidence of radiopaque foreign body or fracture. Electronically Signed   By: Earle Gell M.D.   On: 07/24/2017 13:10    Procedures .Marland KitchenLaceration Repair Date/Time: 07/24/2017 1:55 PM Performed by: Jacqlyn Larsen, PA-C Authorized by: Jacqlyn Larsen, PA-C   Consent:    Consent obtained:  Verbal   Consent given by:  Patient   Risks discussed:  Infection, pain, tendon damage, need for additional repair and poor wound healing   Alternatives discussed:  No treatment Anesthesia (see MAR for exact dosages):    Anesthesia method:  Local infiltration   Local anesthetic:  Lidocaine 2% WITH epi Laceration details:    Location:  Leg   Leg location:  R lower leg   Length (cm):  2.5   Depth (mm):  10 Repair type:    Repair type:  Simple Pre-procedure details:    Preparation:  Patient was prepped and draped in usual sterile fashion and imaging obtained to evaluate for foreign bodies Exploration:    Hemostasis achieved with:  Direct pressure and epinephrine   Wound exploration: wound explored through full range of motion and entire depth of wound probed and visualized     Wound extent: areolar tissue violated and tendon damage (Partial achilles tendon laceration)     Tendon damage location:  Lower extremity   Lower extremity tendon damage location:  Achilles tendon   Tendon damage extent:  Partial transection   Tendon repair plan:  Refer for evaluation Treatment:    Area cleansed with:  Saline   Amount of cleaning:  Standard   Irrigation solution:  Sterile saline   Irrigation method:  Pressure wash Skin repair:    Repair method:  Sutures   Suture size:  4-0   Suture material:  Prolene   Suture technique:  Simple interrupted   Number of sutures:  4 Approximation:    Approximation:  Close Post-procedure details:    Dressing:  Antibiotic ointment and bulky dressing (Cam walker boot)   Patient tolerance of procedure:   Tolerated well, no immediate complications   (including critical care time)  Medications Ordered in ED Medications  lidocaine-EPINEPHrine (XYLOCAINE W/EPI) 2 %-1:200000 (PF) injection 20 mL (20 mLs Infiltration Given by Other 07/24/17 1308)     Initial Impression / Assessment and Plan / ED Course  I have reviewed the triage vital signs and the nursing notes.  Pertinent labs & imaging results that were available during my care of the patient were reviewed by me and considered in my medical decision making (see chart for details).  Patient presents to the ED for evaluation of laceration to the back of the right ankle.  X-ray shows no evidence of foreign body, there is some soft tissue gas along the distal Achilles tendon.  No evidence of bony abnormality.  Right lower extremity is neurovascularly intact  on exam, on exploration of the wound there does appear to be a partial laceration to the Achilles tendon, patient does still have some dorsiflexion with [place] Silos test.  Tetanus is up-to-date.  Laceration irrigated and repaired with simple interrupted sutures.  Patient tolerated well without complication.  Antibiotic ointment and dressing placed and patient placed in cam walker boot, he has crutches at home.  Patient to remain in cam walker nonweightbearing until he follows up with orthopedics.  Patient see Dr. Doreatha Martin in the office.  We will place him on antibiotics given depth of laceration.  Be Profen and Tylenol for pain.  Return precautions discussed.  Patient discussed with Dr. Venora Maples, who saw patient as well and agrees with plan.   Final Clinical Impressions(s) / ED Diagnoses   Final diagnoses:  Laceration of right ankle, initial encounter  Partial Achilles tendon tear, right, initial encounter    ED Discharge Orders        Ordered    cephALEXin (KEFLEX) 500 MG capsule  4 times daily     07/24/17 1409       Janet Berlin 07/24/17 1412    Jola Schmidt, MD 07/24/17  2309

## 2017-07-24 NOTE — ED Triage Notes (Addendum)
Pt reports that around 1am he dropped a glass and cut his posterior right heel area. His wife reports that she made him soak it in Epsom salt bath to "soften skin if he needs stitches".

## 2017-07-24 NOTE — ED Notes (Signed)
Patient transported to X-ray 

## 2017-07-24 NOTE — Discharge Instructions (Addendum)
You have a small laceration to your Achilles tendon in addition to the superficial laceration through the skin, you will need to remain in cam walker boot and use crutches and follow-up with Dr. Doreatha Martin later this week, your sutures will need to be removed in about 7 to 10 days or sooner if determined by the orthopedist.  Please take entire course of antibiotics as directed.  Tylenol and ibuprofen for pain.  If you have worsening pain, swelling, redness or drainage from the area these are signs of infection that should warrant sooner return.

## 2017-07-29 DIAGNOSIS — S91311A Laceration without foreign body, right foot, initial encounter: Secondary | ICD-10-CM | POA: Diagnosis not present

## 2017-08-08 DIAGNOSIS — S91311A Laceration without foreign body, right foot, initial encounter: Secondary | ICD-10-CM | POA: Diagnosis not present

## 2017-10-12 DIAGNOSIS — M9903 Segmental and somatic dysfunction of lumbar region: Secondary | ICD-10-CM | POA: Diagnosis not present

## 2017-10-12 DIAGNOSIS — M5442 Lumbago with sciatica, left side: Secondary | ICD-10-CM | POA: Diagnosis not present

## 2017-10-13 DIAGNOSIS — M9903 Segmental and somatic dysfunction of lumbar region: Secondary | ICD-10-CM | POA: Diagnosis not present

## 2017-10-13 DIAGNOSIS — M5442 Lumbago with sciatica, left side: Secondary | ICD-10-CM | POA: Diagnosis not present

## 2017-10-18 DIAGNOSIS — M5442 Lumbago with sciatica, left side: Secondary | ICD-10-CM | POA: Diagnosis not present

## 2017-10-18 DIAGNOSIS — M9903 Segmental and somatic dysfunction of lumbar region: Secondary | ICD-10-CM | POA: Diagnosis not present

## 2017-10-20 DIAGNOSIS — M5442 Lumbago with sciatica, left side: Secondary | ICD-10-CM | POA: Diagnosis not present

## 2017-10-20 DIAGNOSIS — M9903 Segmental and somatic dysfunction of lumbar region: Secondary | ICD-10-CM | POA: Diagnosis not present

## 2017-10-24 DIAGNOSIS — M5442 Lumbago with sciatica, left side: Secondary | ICD-10-CM | POA: Diagnosis not present

## 2017-10-24 DIAGNOSIS — M9903 Segmental and somatic dysfunction of lumbar region: Secondary | ICD-10-CM | POA: Diagnosis not present

## 2017-10-26 DIAGNOSIS — M9903 Segmental and somatic dysfunction of lumbar region: Secondary | ICD-10-CM | POA: Diagnosis not present

## 2017-10-26 DIAGNOSIS — M5442 Lumbago with sciatica, left side: Secondary | ICD-10-CM | POA: Diagnosis not present

## 2017-10-31 DIAGNOSIS — M9903 Segmental and somatic dysfunction of lumbar region: Secondary | ICD-10-CM | POA: Diagnosis not present

## 2017-10-31 DIAGNOSIS — M5442 Lumbago with sciatica, left side: Secondary | ICD-10-CM | POA: Diagnosis not present

## 2017-11-02 DIAGNOSIS — M9903 Segmental and somatic dysfunction of lumbar region: Secondary | ICD-10-CM | POA: Diagnosis not present

## 2017-11-02 DIAGNOSIS — M5442 Lumbago with sciatica, left side: Secondary | ICD-10-CM | POA: Diagnosis not present

## 2017-11-07 DIAGNOSIS — M5442 Lumbago with sciatica, left side: Secondary | ICD-10-CM | POA: Diagnosis not present

## 2017-11-07 DIAGNOSIS — M9903 Segmental and somatic dysfunction of lumbar region: Secondary | ICD-10-CM | POA: Diagnosis not present

## 2017-11-28 DIAGNOSIS — M5442 Lumbago with sciatica, left side: Secondary | ICD-10-CM | POA: Diagnosis not present

## 2017-11-28 DIAGNOSIS — M9903 Segmental and somatic dysfunction of lumbar region: Secondary | ICD-10-CM | POA: Diagnosis not present

## 2017-11-30 DIAGNOSIS — M9903 Segmental and somatic dysfunction of lumbar region: Secondary | ICD-10-CM | POA: Diagnosis not present

## 2017-11-30 DIAGNOSIS — M5442 Lumbago with sciatica, left side: Secondary | ICD-10-CM | POA: Diagnosis not present

## 2017-12-05 DIAGNOSIS — M9903 Segmental and somatic dysfunction of lumbar region: Secondary | ICD-10-CM | POA: Diagnosis not present

## 2017-12-05 DIAGNOSIS — M5442 Lumbago with sciatica, left side: Secondary | ICD-10-CM | POA: Diagnosis not present

## 2017-12-12 DIAGNOSIS — M9903 Segmental and somatic dysfunction of lumbar region: Secondary | ICD-10-CM | POA: Diagnosis not present

## 2017-12-12 DIAGNOSIS — M5442 Lumbago with sciatica, left side: Secondary | ICD-10-CM | POA: Diagnosis not present

## 2018-03-09 DIAGNOSIS — Z125 Encounter for screening for malignant neoplasm of prostate: Secondary | ICD-10-CM | POA: Diagnosis not present

## 2018-03-09 DIAGNOSIS — E7800 Pure hypercholesterolemia, unspecified: Secondary | ICD-10-CM | POA: Diagnosis not present

## 2018-03-09 DIAGNOSIS — Z0000 Encounter for general adult medical examination without abnormal findings: Secondary | ICD-10-CM | POA: Diagnosis not present

## 2018-03-10 DIAGNOSIS — R079 Chest pain, unspecified: Secondary | ICD-10-CM | POA: Diagnosis not present

## 2018-03-10 DIAGNOSIS — I252 Old myocardial infarction: Secondary | ICD-10-CM | POA: Diagnosis not present

## 2018-03-10 DIAGNOSIS — R0609 Other forms of dyspnea: Secondary | ICD-10-CM | POA: Diagnosis not present

## 2018-03-10 DIAGNOSIS — R001 Bradycardia, unspecified: Secondary | ICD-10-CM | POA: Diagnosis not present

## 2018-03-10 DIAGNOSIS — M25561 Pain in right knee: Secondary | ICD-10-CM | POA: Diagnosis not present

## 2018-03-10 DIAGNOSIS — Z0189 Encounter for other specified special examinations: Secondary | ICD-10-CM | POA: Diagnosis not present

## 2018-03-10 DIAGNOSIS — M170 Bilateral primary osteoarthritis of knee: Secondary | ICD-10-CM | POA: Diagnosis not present

## 2018-03-10 DIAGNOSIS — M47817 Spondylosis without myelopathy or radiculopathy, lumbosacral region: Secondary | ICD-10-CM | POA: Diagnosis not present

## 2018-03-10 DIAGNOSIS — M549 Dorsalgia, unspecified: Secondary | ICD-10-CM | POA: Diagnosis not present

## 2018-06-27 DIAGNOSIS — S61211A Laceration without foreign body of left index finger without damage to nail, initial encounter: Secondary | ICD-10-CM | POA: Diagnosis not present

## 2018-07-05 DIAGNOSIS — S61211A Laceration without foreign body of left index finger without damage to nail, initial encounter: Secondary | ICD-10-CM | POA: Diagnosis not present

## 2018-07-19 DIAGNOSIS — S61211A Laceration without foreign body of left index finger without damage to nail, initial encounter: Secondary | ICD-10-CM | POA: Diagnosis not present

## 2018-08-29 DIAGNOSIS — Z13220 Encounter for screening for lipoid disorders: Secondary | ICD-10-CM | POA: Diagnosis not present

## 2018-08-29 DIAGNOSIS — Z1389 Encounter for screening for other disorder: Secondary | ICD-10-CM | POA: Diagnosis not present

## 2018-09-05 DIAGNOSIS — N529 Male erectile dysfunction, unspecified: Secondary | ICD-10-CM | POA: Diagnosis not present

## 2018-09-05 DIAGNOSIS — M47819 Spondylosis without myelopathy or radiculopathy, site unspecified: Secondary | ICD-10-CM | POA: Diagnosis not present

## 2018-09-05 DIAGNOSIS — E785 Hyperlipidemia, unspecified: Secondary | ICD-10-CM | POA: Diagnosis not present

## 2018-09-05 DIAGNOSIS — M170 Bilateral primary osteoarthritis of knee: Secondary | ICD-10-CM | POA: Diagnosis not present

## 2018-09-13 DIAGNOSIS — I5189 Other ill-defined heart diseases: Secondary | ICD-10-CM | POA: Diagnosis not present

## 2018-09-13 DIAGNOSIS — R0600 Dyspnea, unspecified: Secondary | ICD-10-CM | POA: Diagnosis not present

## 2018-09-13 DIAGNOSIS — R079 Chest pain, unspecified: Secondary | ICD-10-CM | POA: Diagnosis not present

## 2018-11-15 DIAGNOSIS — U071 COVID-19: Secondary | ICD-10-CM | POA: Diagnosis not present

## 2018-11-15 DIAGNOSIS — Z20828 Contact with and (suspected) exposure to other viral communicable diseases: Secondary | ICD-10-CM | POA: Diagnosis not present

## 2018-11-30 DIAGNOSIS — H9313 Tinnitus, bilateral: Secondary | ICD-10-CM | POA: Diagnosis not present

## 2018-11-30 DIAGNOSIS — H903 Sensorineural hearing loss, bilateral: Secondary | ICD-10-CM | POA: Diagnosis not present

## 2018-12-11 DIAGNOSIS — Z23 Encounter for immunization: Secondary | ICD-10-CM | POA: Diagnosis not present

## 2019-01-23 DIAGNOSIS — S336XXA Sprain of sacroiliac joint, initial encounter: Secondary | ICD-10-CM | POA: Diagnosis not present

## 2019-01-23 DIAGNOSIS — M9902 Segmental and somatic dysfunction of thoracic region: Secondary | ICD-10-CM | POA: Diagnosis not present

## 2019-01-23 DIAGNOSIS — M9904 Segmental and somatic dysfunction of sacral region: Secondary | ICD-10-CM | POA: Diagnosis not present

## 2019-01-23 DIAGNOSIS — M5414 Radiculopathy, thoracic region: Secondary | ICD-10-CM | POA: Diagnosis not present

## 2019-01-29 DIAGNOSIS — M5414 Radiculopathy, thoracic region: Secondary | ICD-10-CM | POA: Diagnosis not present

## 2019-01-29 DIAGNOSIS — S336XXA Sprain of sacroiliac joint, initial encounter: Secondary | ICD-10-CM | POA: Diagnosis not present

## 2019-01-29 DIAGNOSIS — M9902 Segmental and somatic dysfunction of thoracic region: Secondary | ICD-10-CM | POA: Diagnosis not present

## 2019-01-29 DIAGNOSIS — M9904 Segmental and somatic dysfunction of sacral region: Secondary | ICD-10-CM | POA: Diagnosis not present

## 2019-01-30 DIAGNOSIS — S336XXA Sprain of sacroiliac joint, initial encounter: Secondary | ICD-10-CM | POA: Diagnosis not present

## 2019-01-30 DIAGNOSIS — M9904 Segmental and somatic dysfunction of sacral region: Secondary | ICD-10-CM | POA: Diagnosis not present

## 2019-01-30 DIAGNOSIS — M5414 Radiculopathy, thoracic region: Secondary | ICD-10-CM | POA: Diagnosis not present

## 2019-01-30 DIAGNOSIS — M9902 Segmental and somatic dysfunction of thoracic region: Secondary | ICD-10-CM | POA: Diagnosis not present

## 2019-06-20 DIAGNOSIS — L570 Actinic keratosis: Secondary | ICD-10-CM | POA: Diagnosis not present

## 2019-06-20 DIAGNOSIS — Z85828 Personal history of other malignant neoplasm of skin: Secondary | ICD-10-CM | POA: Diagnosis not present

## 2019-06-20 DIAGNOSIS — D1801 Hemangioma of skin and subcutaneous tissue: Secondary | ICD-10-CM | POA: Diagnosis not present

## 2019-06-25 DIAGNOSIS — H43811 Vitreous degeneration, right eye: Secondary | ICD-10-CM | POA: Diagnosis not present

## 2019-07-16 IMAGING — CR DG ANKLE COMPLETE 3+V*R*
3 series · 3 of 3 positions shown · non-contrast
Comparison: None.

CLINICAL DATA: Dropped glass on ankle. Pain and posterior
laceration. Initial encounter.

EXAM:
RIGHT ANKLE - COMPLETE 3+ VIEW

[x ankle ap right]
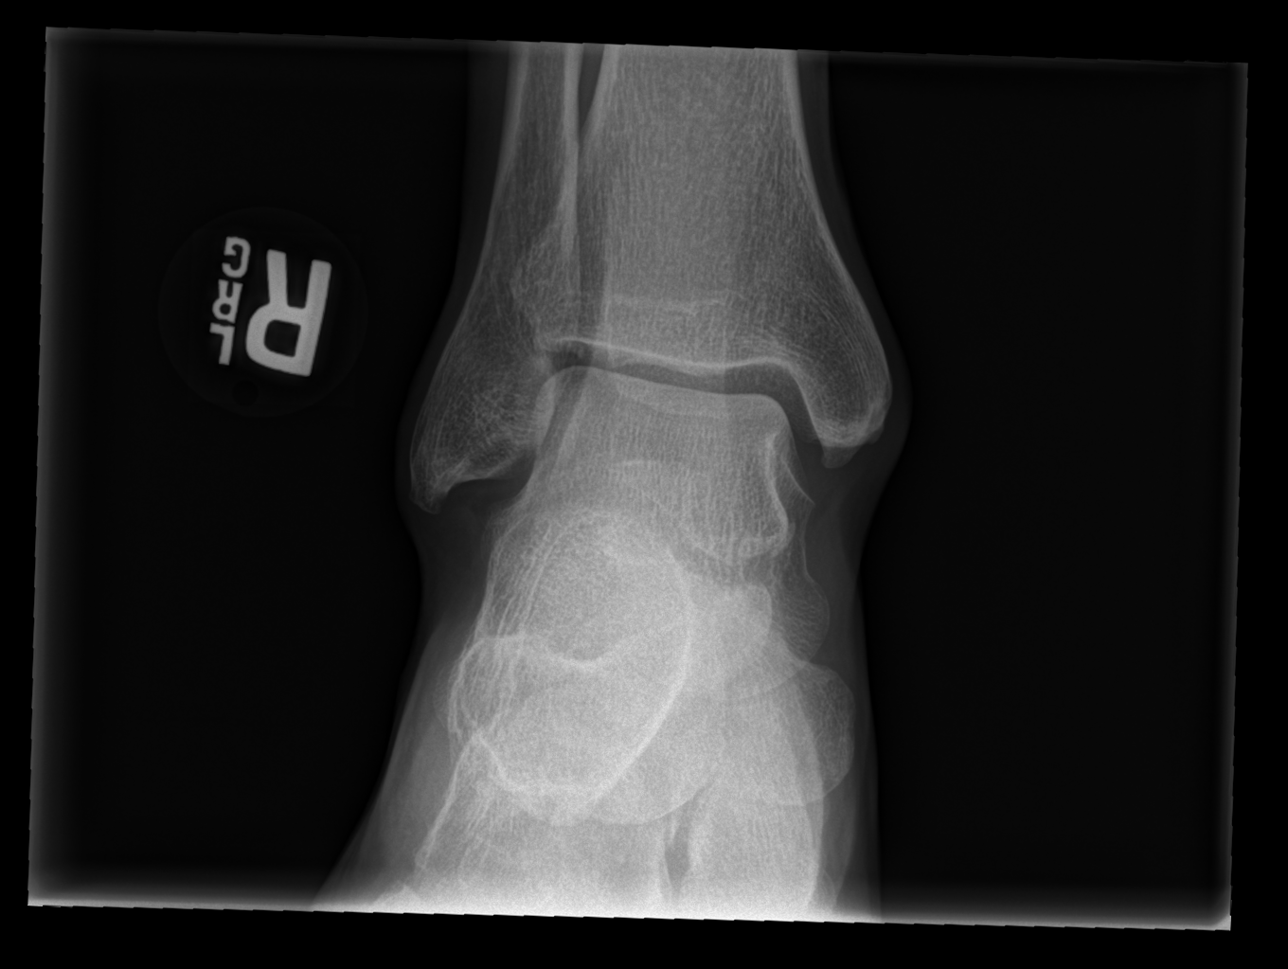

[x ankle lat right]
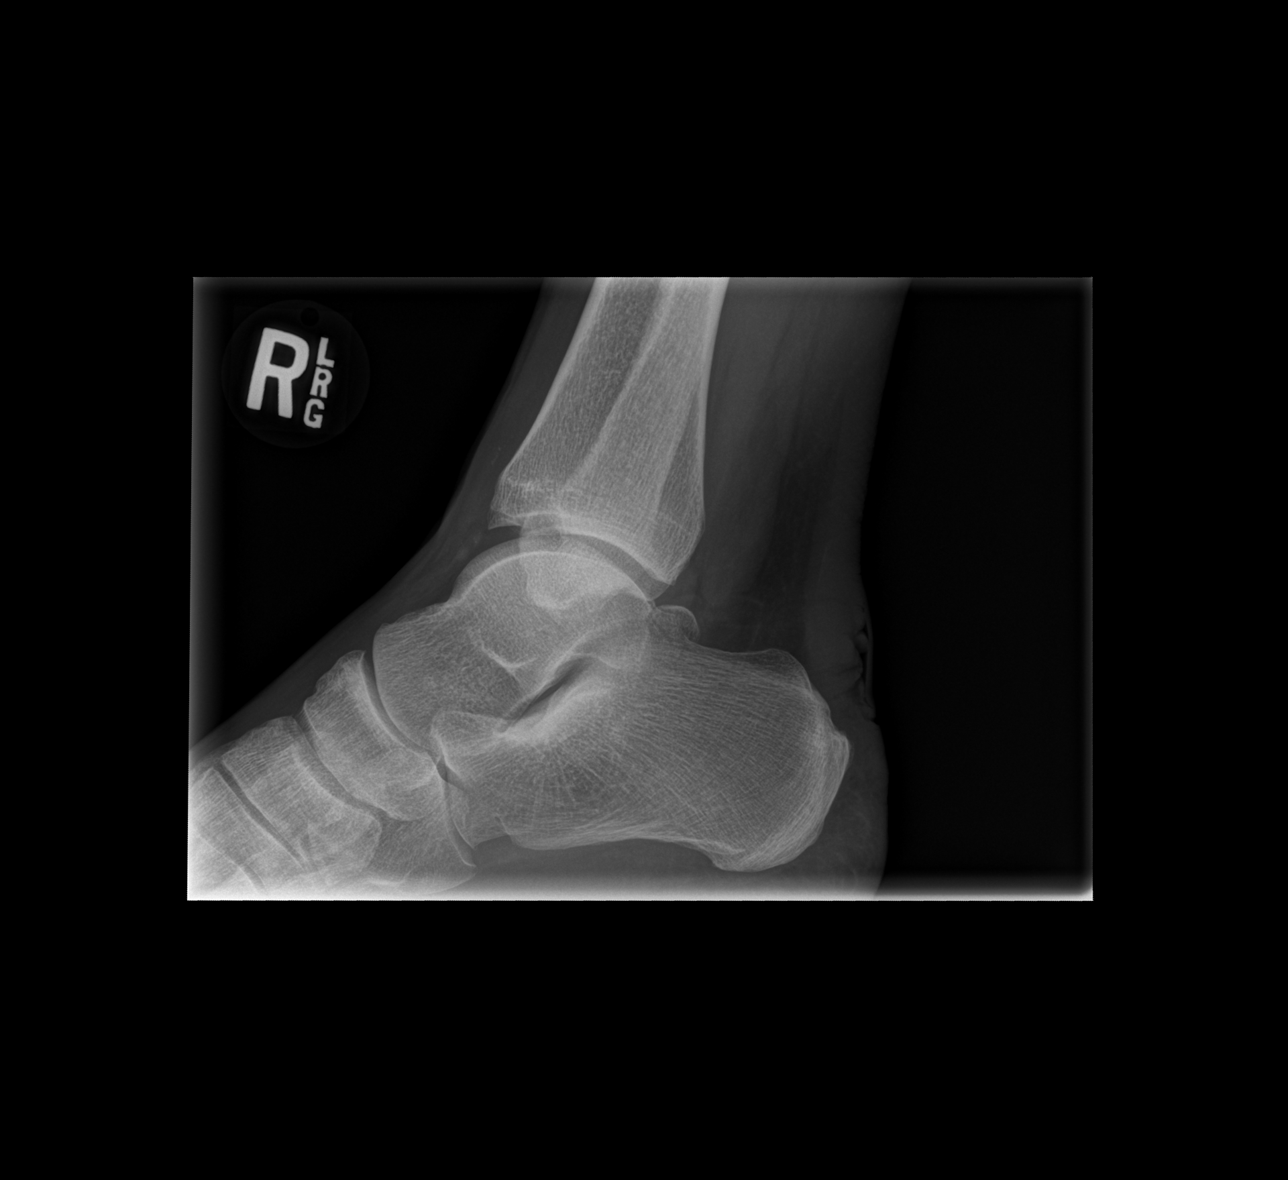

[x ankle obl right]
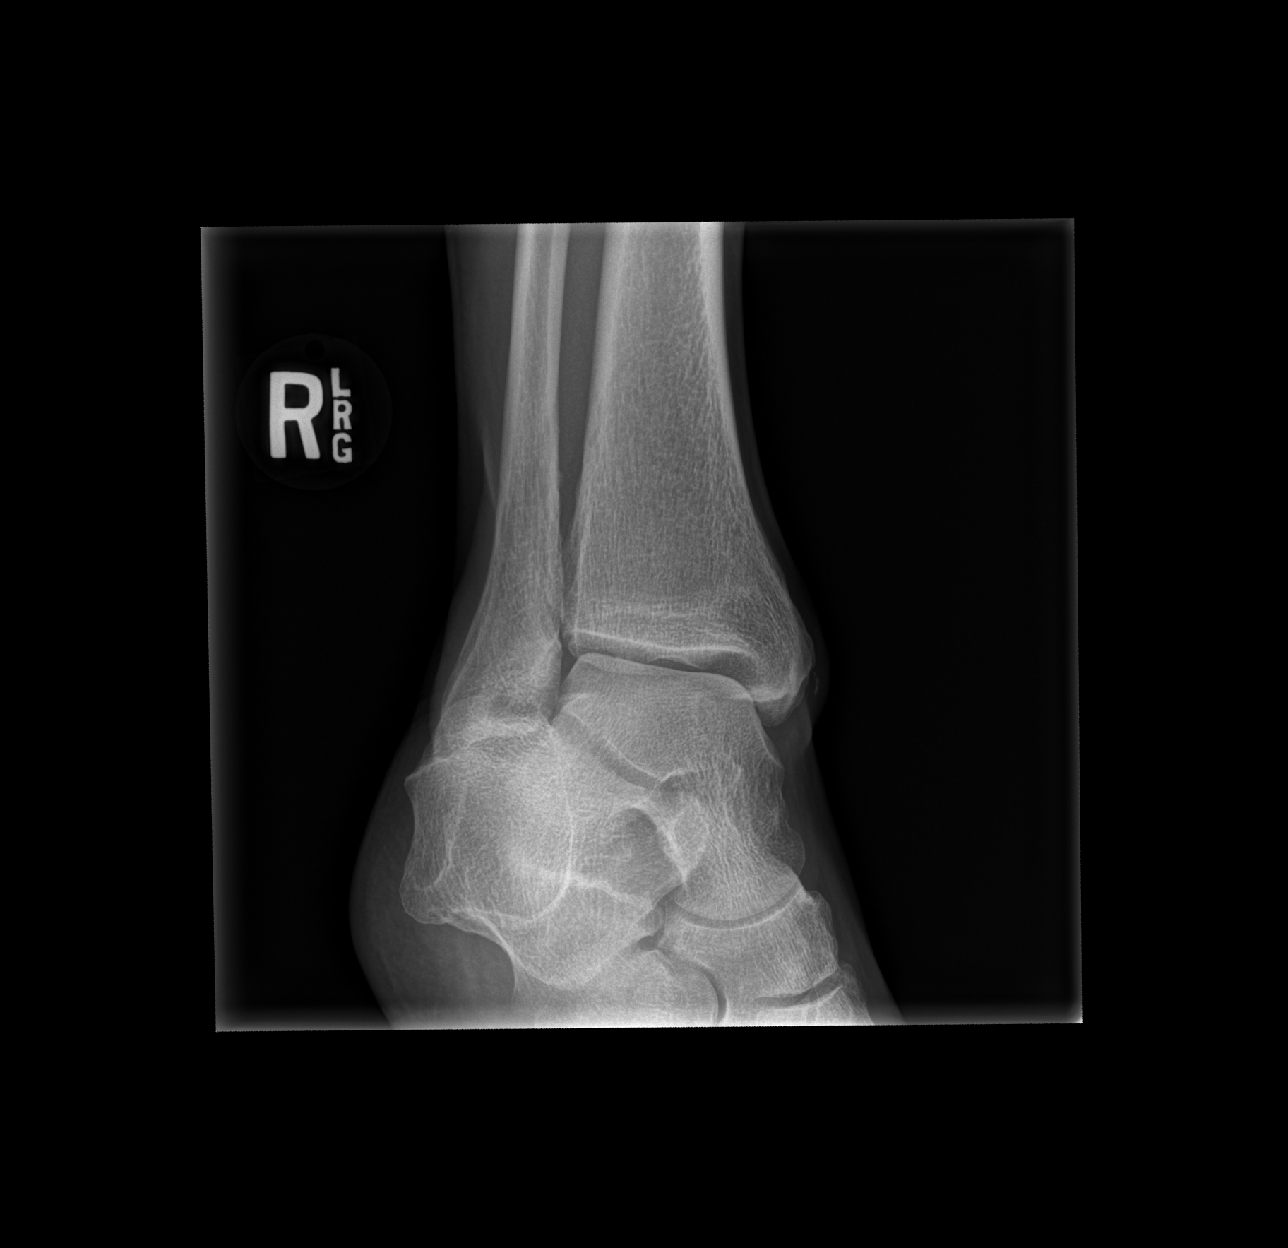

[3 of 3 positions shown; findings below may reference images not displayed]

FINDINGS: There is no evidence of fracture, dislocation, or joint effusion.
There is no evidence of arthropathy or other focal bone abnormality.
Soft tissue gas is seen posteriorly along the distal Achilles
tendon, however there is no evidence of radiopaque foreign body.
IMPRESSION: Soft tissue gas in region of distal Achilles tendon. No evidence of
radiopaque foreign body or fracture.

## 2019-07-31 DIAGNOSIS — H2511 Age-related nuclear cataract, right eye: Secondary | ICD-10-CM | POA: Diagnosis not present

## 2019-07-31 DIAGNOSIS — H25811 Combined forms of age-related cataract, right eye: Secondary | ICD-10-CM | POA: Diagnosis not present

## 2020-01-09 DIAGNOSIS — R439 Unspecified disturbances of smell and taste: Secondary | ICD-10-CM | POA: Diagnosis not present

## 2020-01-09 DIAGNOSIS — J029 Acute pharyngitis, unspecified: Secondary | ICD-10-CM | POA: Diagnosis not present

## 2020-01-21 DIAGNOSIS — Z85828 Personal history of other malignant neoplasm of skin: Secondary | ICD-10-CM | POA: Diagnosis not present

## 2020-01-21 DIAGNOSIS — L570 Actinic keratosis: Secondary | ICD-10-CM | POA: Diagnosis not present

## 2020-01-21 DIAGNOSIS — L309 Dermatitis, unspecified: Secondary | ICD-10-CM | POA: Diagnosis not present

## 2020-03-12 DIAGNOSIS — Z125 Encounter for screening for malignant neoplasm of prostate: Secondary | ICD-10-CM | POA: Diagnosis not present

## 2020-03-12 DIAGNOSIS — E7800 Pure hypercholesterolemia, unspecified: Secondary | ICD-10-CM | POA: Diagnosis not present

## 2020-03-12 DIAGNOSIS — Z0000 Encounter for general adult medical examination without abnormal findings: Secondary | ICD-10-CM | POA: Diagnosis not present

## 2020-03-12 DIAGNOSIS — Z23 Encounter for immunization: Secondary | ICD-10-CM | POA: Diagnosis not present

## 2020-09-24 DIAGNOSIS — Z1211 Encounter for screening for malignant neoplasm of colon: Secondary | ICD-10-CM | POA: Diagnosis not present

## 2020-09-24 DIAGNOSIS — K5730 Diverticulosis of large intestine without perforation or abscess without bleeding: Secondary | ICD-10-CM | POA: Diagnosis not present

## 2020-10-08 DIAGNOSIS — H524 Presbyopia: Secondary | ICD-10-CM | POA: Diagnosis not present

## 2020-10-08 DIAGNOSIS — Z961 Presence of intraocular lens: Secondary | ICD-10-CM | POA: Diagnosis not present

## 2020-10-08 DIAGNOSIS — H52203 Unspecified astigmatism, bilateral: Secondary | ICD-10-CM | POA: Diagnosis not present

## 2020-10-31 DIAGNOSIS — Z20822 Contact with and (suspected) exposure to covid-19: Secondary | ICD-10-CM | POA: Diagnosis not present

## 2020-11-27 DIAGNOSIS — Z85828 Personal history of other malignant neoplasm of skin: Secondary | ICD-10-CM | POA: Diagnosis not present

## 2020-11-27 DIAGNOSIS — L570 Actinic keratosis: Secondary | ICD-10-CM | POA: Diagnosis not present

## 2020-11-27 DIAGNOSIS — L821 Other seborrheic keratosis: Secondary | ICD-10-CM | POA: Diagnosis not present

## 2020-11-27 DIAGNOSIS — D1801 Hemangioma of skin and subcutaneous tissue: Secondary | ICD-10-CM | POA: Diagnosis not present

## 2020-11-27 DIAGNOSIS — D225 Melanocytic nevi of trunk: Secondary | ICD-10-CM | POA: Diagnosis not present

## 2021-03-25 DIAGNOSIS — Z125 Encounter for screening for malignant neoplasm of prostate: Secondary | ICD-10-CM | POA: Diagnosis not present

## 2021-03-25 DIAGNOSIS — Z0000 Encounter for general adult medical examination without abnormal findings: Secondary | ICD-10-CM | POA: Diagnosis not present

## 2021-03-25 DIAGNOSIS — Z23 Encounter for immunization: Secondary | ICD-10-CM | POA: Diagnosis not present

## 2021-03-25 DIAGNOSIS — E7800 Pure hypercholesterolemia, unspecified: Secondary | ICD-10-CM | POA: Diagnosis not present

## 2021-12-01 DIAGNOSIS — L570 Actinic keratosis: Secondary | ICD-10-CM | POA: Diagnosis not present

## 2021-12-01 DIAGNOSIS — D2262 Melanocytic nevi of left upper limb, including shoulder: Secondary | ICD-10-CM | POA: Diagnosis not present

## 2021-12-01 DIAGNOSIS — D485 Neoplasm of uncertain behavior of skin: Secondary | ICD-10-CM | POA: Diagnosis not present

## 2021-12-01 DIAGNOSIS — L821 Other seborrheic keratosis: Secondary | ICD-10-CM | POA: Diagnosis not present

## 2021-12-01 DIAGNOSIS — Z85828 Personal history of other malignant neoplasm of skin: Secondary | ICD-10-CM | POA: Diagnosis not present

## 2022-04-02 DIAGNOSIS — Z125 Encounter for screening for malignant neoplasm of prostate: Secondary | ICD-10-CM | POA: Diagnosis not present

## 2022-04-02 DIAGNOSIS — E7800 Pure hypercholesterolemia, unspecified: Secondary | ICD-10-CM | POA: Diagnosis not present

## 2022-04-02 DIAGNOSIS — K635 Polyp of colon: Secondary | ICD-10-CM | POA: Diagnosis not present

## 2022-04-02 DIAGNOSIS — K4020 Bilateral inguinal hernia, without obstruction or gangrene, not specified as recurrent: Secondary | ICD-10-CM | POA: Diagnosis not present

## 2022-04-02 DIAGNOSIS — Z0000 Encounter for general adult medical examination without abnormal findings: Secondary | ICD-10-CM | POA: Diagnosis not present

## 2022-04-02 DIAGNOSIS — Z23 Encounter for immunization: Secondary | ICD-10-CM | POA: Diagnosis not present

## 2022-04-02 DIAGNOSIS — M720 Palmar fascial fibromatosis [Dupuytren]: Secondary | ICD-10-CM | POA: Diagnosis not present

## 2022-04-02 DIAGNOSIS — Z1159 Encounter for screening for other viral diseases: Secondary | ICD-10-CM | POA: Diagnosis not present

## 2022-07-07 DIAGNOSIS — Z961 Presence of intraocular lens: Secondary | ICD-10-CM | POA: Diagnosis not present

## 2022-07-07 DIAGNOSIS — H524 Presbyopia: Secondary | ICD-10-CM | POA: Diagnosis not present

## 2022-07-07 DIAGNOSIS — H26492 Other secondary cataract, left eye: Secondary | ICD-10-CM | POA: Diagnosis not present

## 2022-12-02 DIAGNOSIS — Z85828 Personal history of other malignant neoplasm of skin: Secondary | ICD-10-CM | POA: Diagnosis not present

## 2022-12-02 DIAGNOSIS — L570 Actinic keratosis: Secondary | ICD-10-CM | POA: Diagnosis not present

## 2022-12-02 DIAGNOSIS — L905 Scar conditions and fibrosis of skin: Secondary | ICD-10-CM | POA: Diagnosis not present

## 2022-12-02 DIAGNOSIS — L821 Other seborrheic keratosis: Secondary | ICD-10-CM | POA: Diagnosis not present

## 2022-12-02 DIAGNOSIS — D225 Melanocytic nevi of trunk: Secondary | ICD-10-CM | POA: Diagnosis not present

## 2023-09-02 ENCOUNTER — Encounter: Payer: Self-pay | Admitting: Advanced Practice Midwife

## 2023-11-01 ENCOUNTER — Other Ambulatory Visit: Payer: Self-pay | Admitting: Family Medicine

## 2023-11-01 DIAGNOSIS — R109 Unspecified abdominal pain: Secondary | ICD-10-CM

## 2023-11-02 ENCOUNTER — Ambulatory Visit
Admission: RE | Admit: 2023-11-02 | Discharge: 2023-11-02 | Disposition: A | Discharge status: Home / Self Care | Source: Ambulatory Visit | Attending: Family Medicine | Admitting: Family Medicine

## 2023-11-02 DIAGNOSIS — R109 Unspecified abdominal pain: Secondary | ICD-10-CM

## 2023-11-02 MED ORDER — IOPAMIDOL (ISOVUE-370) INJECTION 76%
100.0000 mL | Freq: Once | INTRAVENOUS | Status: AC | PRN
  Administered 2023-11-02: 100 mL via INTRAVENOUS
